# Patient Record
Sex: Male | Born: 1965 | Race: White | Hispanic: No | Smoking: Never smoker
Health system: Southern US, Community
[De-identification: ages and names within clinical notes are randomized; demographics above are authoritative.]

## PROBLEM LIST (undated history)

## (undated) DIAGNOSIS — M7661 Achilles tendinitis, right leg: Secondary | ICD-10-CM

## (undated) DIAGNOSIS — C439 Malignant melanoma of skin, unspecified: Secondary | ICD-10-CM

## (undated) DIAGNOSIS — Z9889 Other specified postprocedural states: Secondary | ICD-10-CM

## (undated) DIAGNOSIS — I8393 Asymptomatic varicose veins of bilateral lower extremities: Secondary | ICD-10-CM

## (undated) DIAGNOSIS — C801 Malignant (primary) neoplasm, unspecified: Secondary | ICD-10-CM

## (undated) DIAGNOSIS — Z87442 Personal history of urinary calculi: Secondary | ICD-10-CM

## (undated) HISTORY — PX: OPEN REPAIR PERIARTICULAR FRACTURE / DISLOCATION ELBOW: SUR901

## (undated) HISTORY — PX: HERNIA REPAIR: SHX51

## (undated) HISTORY — PX: NASAL FRACTURE SURGERY: SHX718

## (undated) HISTORY — PX: MR FOOT LEFT (ARMC HX): HXRAD1768

## (undated) HISTORY — PX: ROTATOR CUFF REPAIR: SHX139

## (undated) HISTORY — PX: OTHER SURGICAL HISTORY: SHX169

## (undated) HISTORY — PX: BASAL CELL CARCINOMA EXCISION: SHX1214

## (undated) HISTORY — PX: KNEE ARTHROSCOPY W/ MENISCAL REPAIR: SHX1877

---

## 1998-06-01 ENCOUNTER — Ambulatory Visit (HOSPITAL_COMMUNITY): Admission: RE | Admit: 1998-06-01 | Discharge: 1998-06-01 | Payer: Self-pay | Admitting: Orthopedic Surgery

## 2000-08-16 ENCOUNTER — Other Ambulatory Visit: Admission: RE | Admit: 2000-08-16 | Discharge: 2000-08-16 | Payer: Self-pay | Admitting: Urology

## 2000-08-16 ENCOUNTER — Encounter (INDEPENDENT_AMBULATORY_CARE_PROVIDER_SITE_OTHER): Payer: Self-pay | Admitting: Specialist

## 2000-12-21 ENCOUNTER — Ambulatory Visit (HOSPITAL_BASED_OUTPATIENT_CLINIC_OR_DEPARTMENT_OTHER): Admission: RE | Admit: 2000-12-21 | Discharge: 2000-12-21 | Payer: Self-pay | Admitting: Family Medicine

## 2001-01-17 ENCOUNTER — Ambulatory Visit (HOSPITAL_BASED_OUTPATIENT_CLINIC_OR_DEPARTMENT_OTHER): Admission: RE | Admit: 2001-01-17 | Discharge: 2001-01-17 | Payer: Self-pay | Admitting: Internal Medicine

## 2003-10-03 ENCOUNTER — Other Ambulatory Visit: Payer: Self-pay

## 2003-10-04 ENCOUNTER — Other Ambulatory Visit: Payer: Self-pay

## 2005-12-08 ENCOUNTER — Other Ambulatory Visit: Payer: Self-pay

## 2005-12-08 ENCOUNTER — Observation Stay: Payer: Self-pay | Admitting: Internal Medicine

## 2006-11-15 ENCOUNTER — Encounter: Admission: RE | Admit: 2006-11-15 | Discharge: 2006-11-15 | Payer: Self-pay | Admitting: Family Medicine

## 2007-03-05 ENCOUNTER — Inpatient Hospital Stay (HOSPITAL_COMMUNITY): Admission: RE | Admit: 2007-03-05 | Discharge: 2007-03-06 | Payer: Self-pay | Admitting: General Surgery

## 2008-06-08 ENCOUNTER — Encounter: Admission: RE | Admit: 2008-06-08 | Discharge: 2008-06-08 | Payer: Self-pay | Admitting: Family Medicine

## 2008-09-01 IMAGING — CT CT ABD-PELV W/O CM
1 of 3 series · 14 of 32 positions shown, 20 images · non-contrast
Comparison: NONE

CLINICAL DATA: Abdominal pain.  Evaluate for hernia. 

CT ABDOMEN AND PELVIS WITHOUT INTRAVENOUS OR ORAL CONTRAST
TECHNIQUE: Multiple axial 3 millimeter thick slices at 3 
millimeter increments were obtained from the lung base through the 
pelvis.

[Series 2: wo · axial · 0.81mm/px · z∈[+463,+868]mm · 14 of 93 slices shown, 20 images]
[im 6/93  soft-tissue]
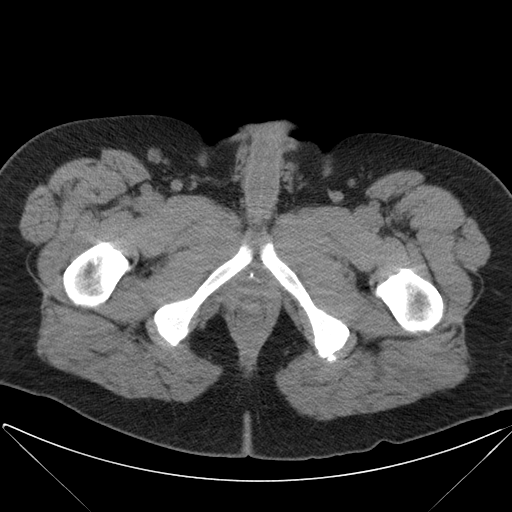
[im 6/93  bone]
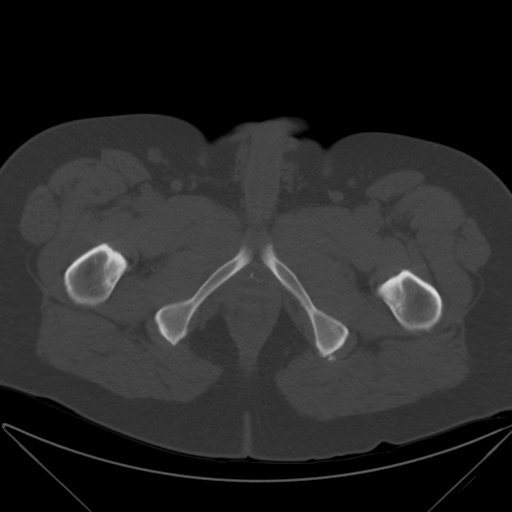
[im 11/93  soft-tissue]
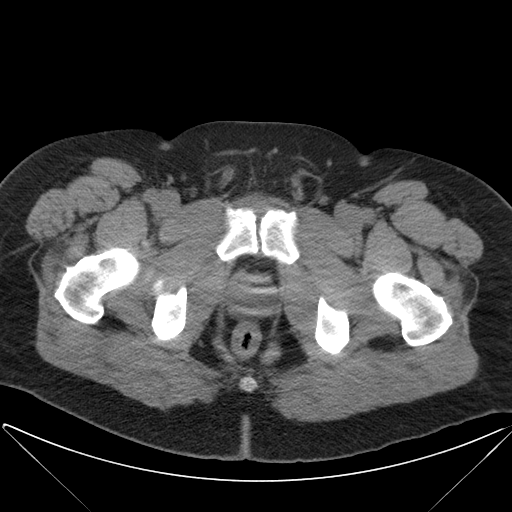
[im 17/93  soft-tissue]
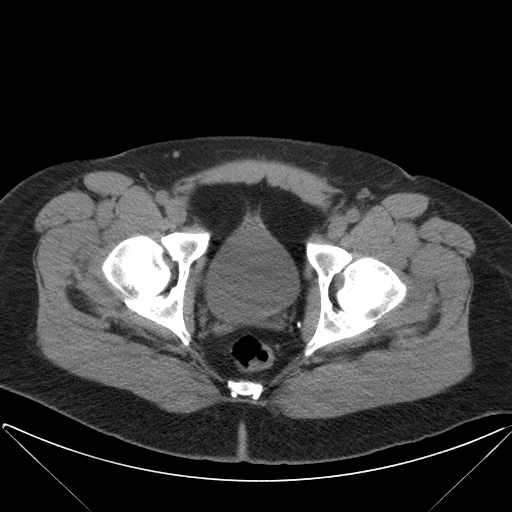
[im 28/93  soft-tissue]
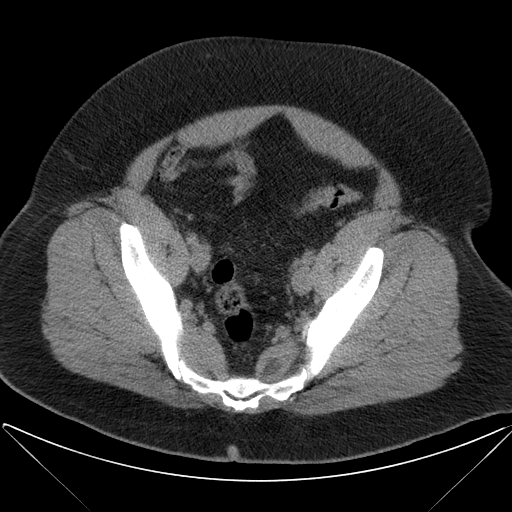
[im 33/93  soft-tissue]
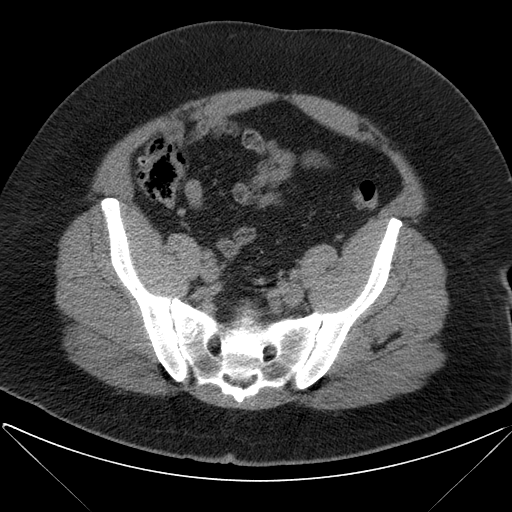
[im 38/93  soft-tissue]
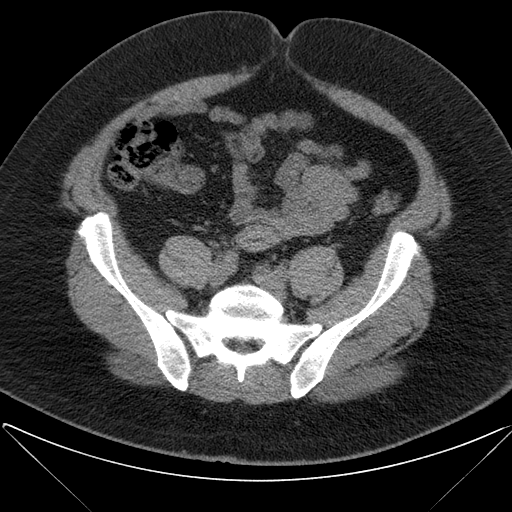
[im 44/93  soft-tissue]
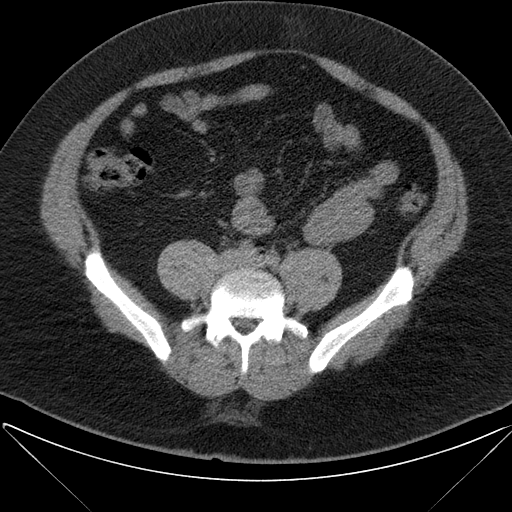
[im 49/93  soft-tissue]
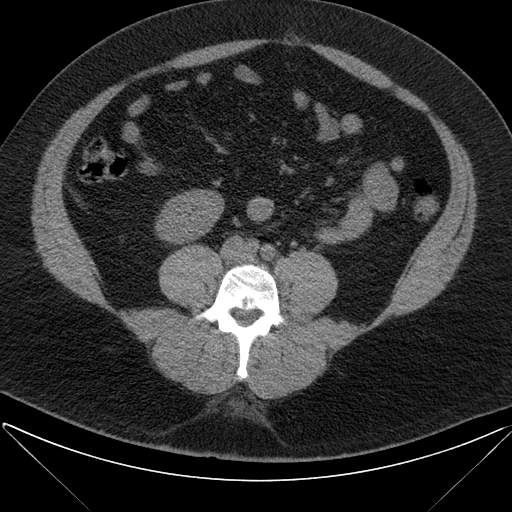
[im 55/93  soft-tissue]
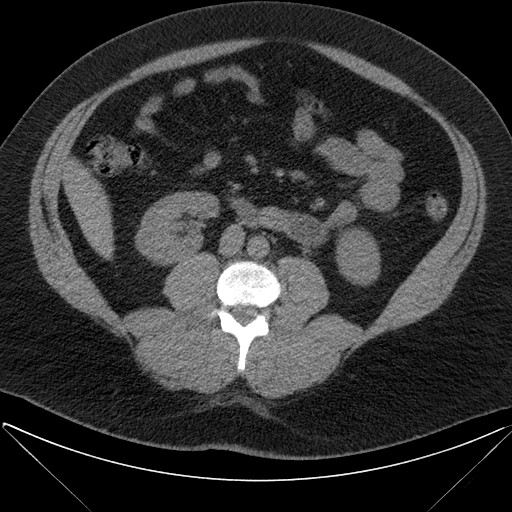
[im 55/93  bone]
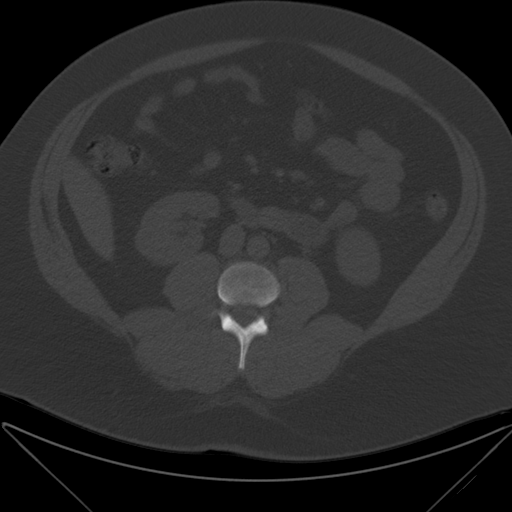
[im 60/93  soft-tissue]
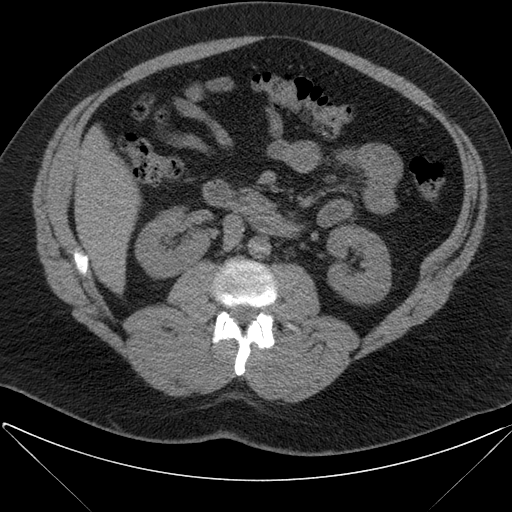
[im 71/93  soft-tissue]
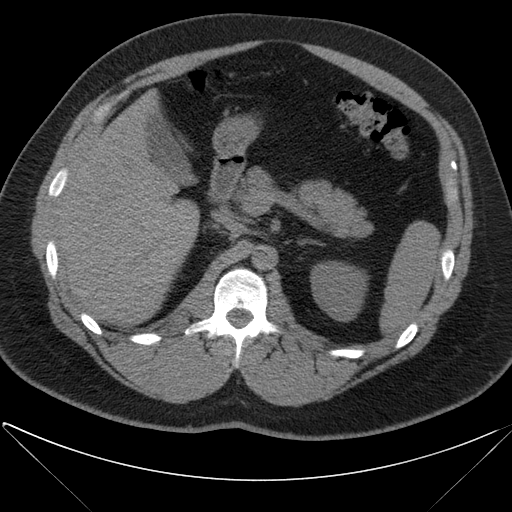
[im 71/93  lung]
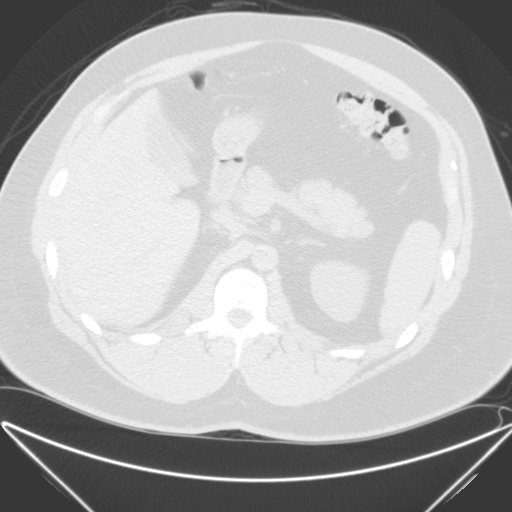
[im 76/93  soft-tissue]
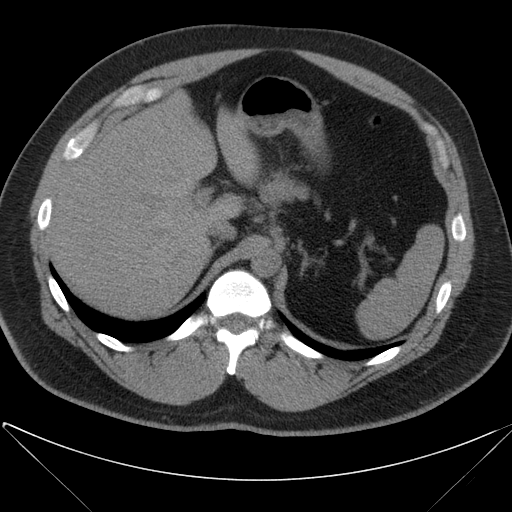
[im 76/93  lung]
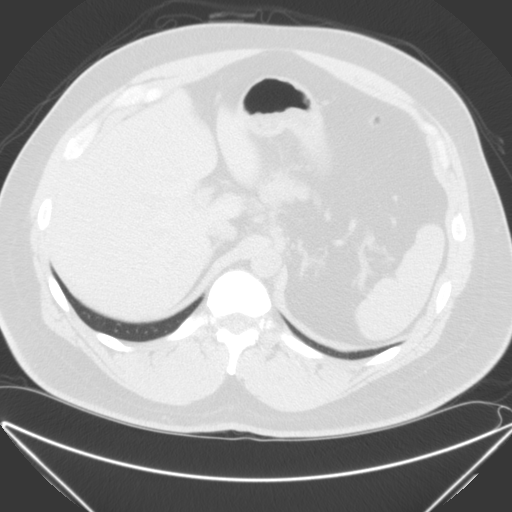
[im 82/93  soft-tissue]
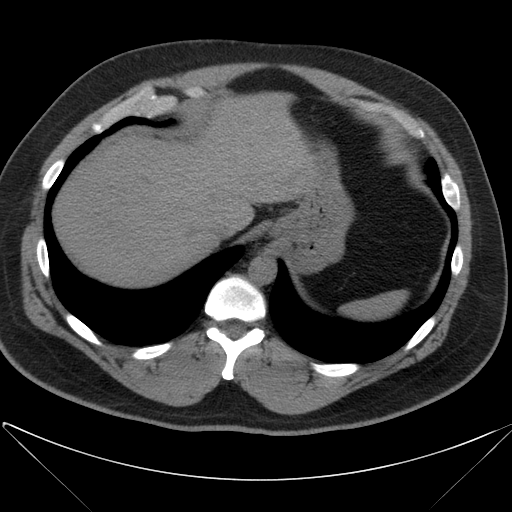
[im 82/93  lung]
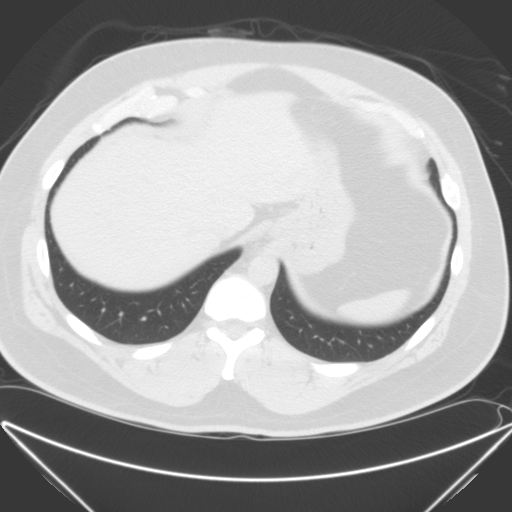
[im 87/93  soft-tissue]
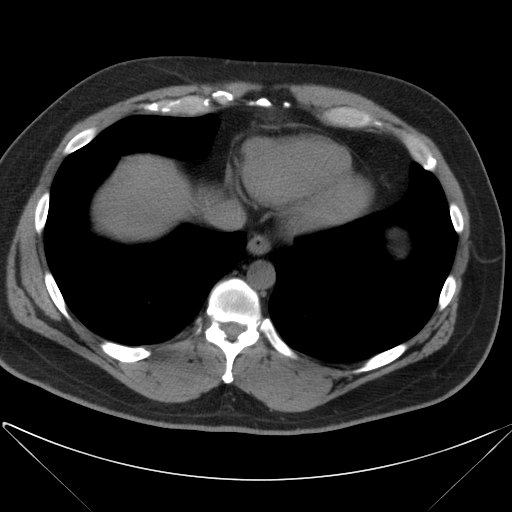
[im 87/93  lung]
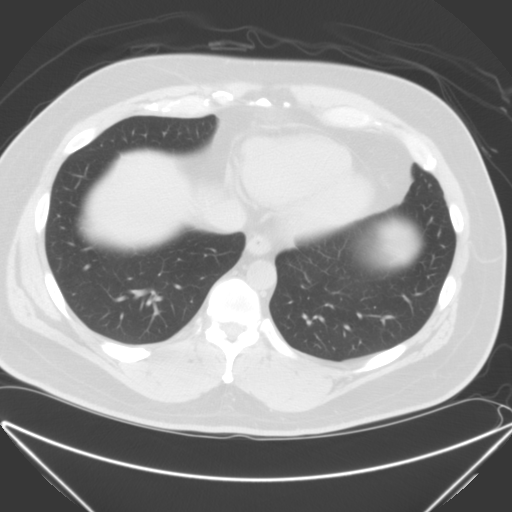

[14 of 32 positions shown; findings below may reference images not displayed]

FINDINGS: No gallstones, renal, or ureteral calculi. No bowel, 
mesenteric, pelvic, or inguinal mass, adenopathy, or inflammatory 
process.  Liver pancreas, spleen, kidneys, and aorta are 
unremarkable.  No evidence of appendicitis, diverticulitis, 
hernia, or bowel obstruction.
IMPRESSION: Negative CT of the abdomen and pelvis without 
intravenous or oral contrast.  No evidence of hernia. Hunberto Geiser 
02/11/2007  Tran Date: 02/11/2007 DAS  JLM

## 2009-05-13 ENCOUNTER — Ambulatory Visit: Payer: Self-pay | Admitting: Otolaryngology

## 2011-03-31 NOTE — Op Note (Signed)
NAME:  Tyler Molina, Tyler Molina                   ACCOUNT NO.:  [id number]   MEDICAL RECORD NO.:  [id number]          PATIENT TYPE:  AMB   LOCATION:  DAY                          FACILITY:  [facility]   PHYSICIAN:  Adolph Pollack, M.D.DATE OF BIRTH:  1966/08/21   DATE OF PROCEDURE:  03/04/2007  DATE OF DISCHARGE:                               OPERATIVE REPORT   PREOPERATIVE DIAGNOSIS:  Ventral hernia.   POSTOPERATIVE DIAGNOSES:  1. Ventral hernia.  2. Small umbilical hernia.   PROCEDURE:  Laparoscopic ventral hernia repair with mesh.   SURGEON:  Adolph Pollack, M.D.   ASSISTANT:  Wilmon Arms. Tsuei, M.D.   ANESTHESIA:  General.   INDICATIONS:  This 45 year old male had been doing some heavy lifting  and noticed a tender bulge.  He underwent a CT scan to evaluate this and  this showed a small ventral hernia in the epigastric region with some  omental fat outside it.  In the office, it was tender I could not  completely reduce it.  He has had no obstructive symptoms.  He now  presents for hernia repair.  He weighs over 300 pounds and thus a  laparoscopic approach has been recommended.   TECHNIQUE:  He was seen in the holding area, then brought to the  operating room, placed supine on the operating table and a general  anesthetic was administered.  The hair on the abdominal wall was clipped  and the area sterilely prepped and draped.  A 5-6 mm incision was made  in the left upper quadrant.  Using the 5 mm laparoscope, and an OptiVu,  access to the peritoneal cavity was gained and a pneumoperitoneum was  created.  I carefully examined the area below the 5 mm trocar and saw no  evidence of solid organ or hollow organ injury.  I was able to visualize  the hernia which was in the epigastric region about 4 cm superior to the  umbilicus.  There was omentum up in it.  A 5 mm trocar was placed in  right mid abdomen, an 11 mm trocar placed in the left mid abdomen.  I  was able to reduce the omentum  from the hernia defect.  I then noticed  he had a small umbilical defect as well.  I used spinal needles to mark  about 4-5 cm away from both defects inferiorly, superiorly and on both  sides.  I then brought the piece of 20 x 15 cm Parietex mesh with a  nonadhesive barrier into the field and placed four anchoring sutures in  the four quadrants.  The mesh was hydrated, rolled up, and then placed  into the abdominal cavity.  I then made four stab incisions in four  areas around the hernia.  The anchoring sutures were then brought up  across the fascial bridge on each of the four areas and then tied down  anchoring the mesh to the anterior abdominal wall with the nonadherent  barrier side pointing toward the viscera.  I further anchored the mesh  to the abdominal wall with a spiral  tacker.  This provided adequate  coverage and adequate overlap of the defects.  Following this, I  inspected the area in all quadrants and noted no bleeding or visceral  injury.  I then removed the trocars and released the CO2 gas.  The skin  incisions were closed with 4-0 Monocryl subcuticular stitches followed  by Steri-Strips and sterile dressings.  He tolerated the procedure  without any apparent complications and was taken to the recovery room in  satisfactory condition.      Adolph Pollack, M.D.  Electronically Signed     TJR/MEDQ  D:  03/04/2007  T:  03/04/2007  Job:  [id number]   cc:   Gretta Arab. Valentina Lucks, M.D.  Fax: [phone]

## 2022-04-13 ENCOUNTER — Other Ambulatory Visit: Payer: Self-pay | Admitting: Podiatry

## 2022-04-19 ENCOUNTER — Other Ambulatory Visit: Payer: Self-pay

## 2022-04-19 ENCOUNTER — Encounter
Admission: RE | Admit: 2022-04-19 | Discharge: 2022-04-19 | Disposition: A | Discharge status: Home / Self Care | Payer: PRIVATE HEALTH INSURANCE | Source: Ambulatory Visit | Attending: Podiatry | Admitting: Podiatry

## 2022-04-19 VITALS — Ht 72.0 in | Wt 319.0 lb

## 2022-04-19 DIAGNOSIS — Z01812 Encounter for preprocedural laboratory examination: Secondary | ICD-10-CM

## 2022-04-19 HISTORY — DX: Malignant (primary) neoplasm, unspecified: C80.1

## 2022-04-19 HISTORY — DX: Personal history of urinary calculi: Z87.442

## 2022-04-19 HISTORY — DX: Achilles tendinitis, left leg: M76.61

## 2022-04-19 HISTORY — DX: Nausea with vomiting, unspecified: Z98.890

## 2022-04-19 HISTORY — DX: Asymptomatic varicose veins of bilateral lower extremities: I83.93

## 2022-04-19 HISTORY — DX: Malignant melanoma of skin, unspecified: C43.9

## 2022-04-19 NOTE — Patient Instructions (Addendum)
Your procedure is scheduled on: 04/21/22 - Friday Report to the Registration Desk on the 1st floor of the [facility]. To find out your arrival time, please call [phone] between 1PM - 3PM on: 04/20/22 - Thursday If your arrival time is 6:00 am, do not arrive prior to that time as the [place] entrance doors do not open until 6:00 am.  REMEMBER: Instructions that are not followed completely may result in serious medical risk, up to and including death; or upon the discretion of your surgeon and anesthesiologist your surgery may need to be rescheduled.  Do not eat food after midnight the night before surgery.  No gum chewing, lozengers or hard candies.  You may however, drink CLEAR liquids up to 2 hours before you are scheduled to arrive for your surgery. Do not drink anything within 2 hours of your scheduled arrival time.  Clear liquids include: - water  - apple juice without pulp - gatorade (not RED colors) - black coffee or tea (Do NOT add milk or creamers to the coffee or tea) Do NOT drink anything that is not on this list.  TAKE THESE MEDICATIONS THE MORNING OF SURGERY WITH A SIP OF WATER: NONE  One week prior to surgery: diclofenac (VOLTAREN) 75 MG EC tablet Stop Anti-inflammatories (NSAIDS) such as Advil, Aleve, Ibuprofen, Motrin, Naproxen, Naprosyn and Aspirin based products such as Excedrin, Goodys Powder, BC Powder.  Stop ANY OVER THE COUNTER supplements until after surgery.  You may however, continue to take Tylenol if needed for pain up until the day of surgery.  No Alcohol for 24 hours before or after surgery.  No Smoking including e-cigarettes for 24 hours prior to surgery.  No chewable tobacco products for at least 6 hours prior to surgery.  No nicotine patches on the day of surgery.  Do not use any "recreational" drugs for at least a week prior to your surgery.  Please be advised that the combination of cocaine and anesthesia may have negative outcomes,  up to and including death. If you test positive for cocaine, your surgery will be cancelled.  On the morning of surgery brush your teeth with toothpaste and water, you may rinse your mouth with mouthwash if you wish. Do not swallow any toothpaste or mouthwash.  Do not wear jewelry, make-up, hairpins, clips or nail polish.  Do not wear lotions, powders, or perfumes.   Do not shave body from the neck down 48 hours prior to surgery just in case you cut yourself which could leave a site for infection.  Also, freshly shaved skin may become irritated if using the CHG soap.  Contact lenses, hearing aids and dentures may not be worn into surgery.  Do not bring valuables to the hospital. [facility] is not responsible for any missing/lost belongings or valuables.    Notify your doctor if there is any change in your medical condition (cold, fever, infection).  Wear comfortable clothing (specific to your surgery type) to the hospital.  After surgery, you can help prevent lung complications by doing breathing exercises.  Take deep breaths and cough every 1-2 hours. Your doctor may order a device called an Incentive Spirometer to help you take deep breaths. When coughing or sneezing, hold a pillow firmly against your incision with both hands. This is called "splinting." Doing this helps protect your incision. It also decreases belly discomfort.  If you are being admitted to the hospital overnight, leave your suitcase in the car. After surgery it may  be brought to your room.  If you are being discharged the day of surgery, you will not be allowed to drive home. You will need a responsible adult (18 years or older) to drive you home and stay with you that night.   If you are taking public transportation, you will need to have a responsible adult (18 years or older) with you. Please confirm with your physician that it is acceptable to use public transportation.   Please call the [facility] Dept. at [phone] if you have any questions about these instructions.  Surgery Visitation Policy:  Patients undergoing a surgery or procedure may have two family members or support persons with them as long as the person is not COVID-19 positive or experiencing its symptoms.   Inpatient Visitation:    Visiting hours are 7 a.m. to 8 p.m. Up to four visitors are allowed at one time in a patient room, including children. The visitors may rotate out with other people during the day. One designated support person (adult) may remain overnight.

## 2022-04-21 ENCOUNTER — Ambulatory Visit
Admission: RE | Admit: 2022-04-21 | Discharge: 2022-04-21 | Disposition: A | Discharge status: Home / Self Care | Payer: PRIVATE HEALTH INSURANCE | Attending: Podiatry | Admitting: Podiatry

## 2022-04-21 ENCOUNTER — Ambulatory Visit: Payer: PRIVATE HEALTH INSURANCE

## 2022-04-21 ENCOUNTER — Encounter: Payer: Self-pay | Admitting: Podiatry

## 2022-04-21 ENCOUNTER — Ambulatory Visit: Payer: PRIVATE HEALTH INSURANCE | Admitting: Anesthesiology

## 2022-04-21 ENCOUNTER — Other Ambulatory Visit: Payer: Self-pay

## 2022-04-21 ENCOUNTER — Encounter
Admission: RE | Disposition: A | Discharge status: Home / Self Care | Payer: Self-pay | Source: Home / Self Care | Attending: Podiatry

## 2022-04-21 DIAGNOSIS — X58XXXA Exposure to other specified factors, initial encounter: Secondary | ICD-10-CM | POA: Insufficient documentation

## 2022-04-21 DIAGNOSIS — S86011A Strain of right Achilles tendon, initial encounter: Secondary | ICD-10-CM | POA: Diagnosis present

## 2022-04-21 DIAGNOSIS — M899 Disorder of bone, unspecified: Secondary | ICD-10-CM | POA: Insufficient documentation

## 2022-04-21 DIAGNOSIS — Z6841 Body Mass Index (BMI) 40.0 and over, adult: Secondary | ICD-10-CM | POA: Diagnosis not present

## 2022-04-21 DIAGNOSIS — Z01812 Encounter for preprocedural laboratory examination: Secondary | ICD-10-CM

## 2022-04-21 HISTORY — PX: BONE EXCISION: SHX6730

## 2022-04-21 HISTORY — PX: ACHILLES TENDON SURGERY: SHX542

## 2022-04-21 LAB — BASIC METABOLIC PANEL
Anion gap: 4 — ABNORMAL LOW (ref 5–15)
BUN: 18 mg/dL (ref 6–20)
CO2: 25 mmol/L (ref 22–32)
Calcium: 8.6 mg/dL — ABNORMAL LOW (ref 8.9–10.3)
Chloride: 109 mmol/L (ref 98–111)
Creatinine, Ser: 1.05 mg/dL (ref 0.61–1.24)
GFR, Estimated: >60 mL/min (ref >60)
Glucose, Bld: 98 mg/dL (ref 70–99)
Potassium: 4 mmol/L (ref 3.5–5.1)
Sodium: 138 mmol/L (ref 135–145)

## 2022-04-21 SURGERY — REPAIR, TENDON, ACHILLES
Anesthesia: General | Site: Foot | Laterality: Right

## 2022-04-21 MED ORDER — SUGAMMADEX SODIUM 200 MG/2ML IV SOLN
INTRAVENOUS | Status: DC | PRN
  Administered 2022-04-21: 200 mg via INTRAVENOUS

## 2022-04-21 MED ORDER — FAMOTIDINE 20 MG PO TABS
ORAL_TABLET | ORAL | Status: AC
  Administered 2022-04-21: 20 mg via ORAL
  Filled 2022-04-21: qty 1

## 2022-04-21 MED ORDER — METOCLOPRAMIDE HCL 10 MG PO TABS: 5.0000 mg | ORAL_TABLET | Freq: Three times a day (TID) | ORAL | Status: DC | PRN

## 2022-04-21 MED ORDER — CEFAZOLIN IN SODIUM CHLORIDE 3-0.9 GM/100ML-% IV SOLN
3.0000 g | INTRAVENOUS | Status: AC
  Administered 2022-04-21: 3 g via INTRAVENOUS
  Filled 2022-04-21: qty 100

## 2022-04-21 MED ORDER — OXYCODONE HCL 5 MG PO TABS
5.0000 mg | ORAL_TABLET | Freq: Once | ORAL | Status: AC | PRN
  Administered 2022-04-21: 5 mg via ORAL

## 2022-04-21 MED ORDER — BUPIVACAINE-EPINEPHRINE (PF) 0.25% -1:200000 IJ SOLN
INTRAMUSCULAR | Status: AC
  Filled 2022-04-21: qty 30

## 2022-04-21 MED ORDER — MIDAZOLAM HCL 2 MG/2ML IJ SOLN
INTRAMUSCULAR | Status: DC | PRN
  Administered 2022-04-21: 2 mg via INTRAVENOUS

## 2022-04-21 MED ORDER — ONDANSETRON HCL 4 MG/2ML IJ SOLN: 4.0000 mg | Freq: Four times a day (QID) | INTRAMUSCULAR | Status: DC | PRN

## 2022-04-21 MED ORDER — ONDANSETRON HCL 4 MG PO TABS: 4.0000 mg | ORAL_TABLET | Freq: Four times a day (QID) | ORAL | Status: DC | PRN

## 2022-04-21 MED ORDER — FENTANYL CITRATE (PF) 100 MCG/2ML IJ SOLN
INTRAMUSCULAR | Status: AC
  Filled 2022-04-21: qty 2

## 2022-04-21 MED ORDER — OXYCODONE HCL 5 MG/5ML PO SOLN: 5.0000 mg | Freq: Once | ORAL | Status: AC | PRN

## 2022-04-21 MED ORDER — FENTANYL CITRATE (PF) 100 MCG/2ML IJ SOLN
INTRAMUSCULAR | Status: AC
  Administered 2022-04-21: 50 ug via INTRAVENOUS
  Filled 2022-04-21: qty 2

## 2022-04-21 MED ORDER — OXYCODONE HCL 5 MG PO TABS
ORAL_TABLET | ORAL | Status: AC
  Filled 2022-04-21: qty 1

## 2022-04-21 MED ORDER — PROPOFOL 10 MG/ML IV BOLUS
INTRAVENOUS | Status: DC | PRN
  Administered 2022-04-21: 200 mg via INTRAVENOUS
  Administered 2022-04-21: 40 mg via INTRAVENOUS

## 2022-04-21 MED ORDER — LIDOCAINE HCL (PF) 1 % IJ SOLN
INTRAMUSCULAR | Status: DC | PRN
  Administered 2022-04-21 (×2): 1 mL via INTRADERMAL

## 2022-04-21 MED ORDER — BUPIVACAINE LIPOSOME 1.3 % IJ SUSP
INTRAMUSCULAR | Status: DC | PRN
  Administered 2022-04-21: 10 mL

## 2022-04-21 MED ORDER — KETAMINE HCL 50 MG/5ML IJ SOSY
PREFILLED_SYRINGE | INTRAMUSCULAR | Status: AC
  Filled 2022-04-21: qty 5

## 2022-04-21 MED ORDER — CEFAZOLIN SODIUM-DEXTROSE 2-4 GM/100ML-% IV SOLN
INTRAVENOUS | Status: AC
  Filled 2022-04-21: qty 100

## 2022-04-21 MED ORDER — BUPIVACAINE LIPOSOME 1.3 % IJ SUSP
INTRAMUSCULAR | Status: AC
  Filled 2022-04-21: qty 10

## 2022-04-21 MED ORDER — FENTANYL CITRATE (PF) 100 MCG/2ML IJ SOLN
25.0000 ug | INTRAMUSCULAR | Status: DC | PRN
  Administered 2022-04-21 (×2): 50 ug via INTRAVENOUS

## 2022-04-21 MED ORDER — ACETAMINOPHEN 10 MG/ML IV SOLN: 1000.0000 mg | Freq: Once | INTRAVENOUS | Status: DC | PRN

## 2022-04-21 MED ORDER — PHENYLEPHRINE HCL (PRESSORS) 10 MG/ML IV SOLN: INTRAVENOUS | Status: DC | PRN

## 2022-04-21 MED ORDER — BUPIVACAINE HCL (PF) 0.5 % IJ SOLN
INTRAMUSCULAR | Status: AC
  Filled 2022-04-21: qty 30

## 2022-04-21 MED ORDER — LIDOCAINE HCL (CARDIAC) PF 100 MG/5ML IV SOSY: PREFILLED_SYRINGE | INTRAVENOUS | Status: DC | PRN

## 2022-04-21 MED ORDER — CHLORHEXIDINE GLUCONATE 0.12 % MT SOLN: 15.0000 mL | Freq: Once | OROMUCOSAL | Status: AC

## 2022-04-21 MED ORDER — DROPERIDOL 2.5 MG/ML IJ SOLN: 0.6250 mg | Freq: Once | INTRAMUSCULAR | Status: DC | PRN

## 2022-04-21 MED ORDER — OXYCODONE-ACETAMINOPHEN 5-325 MG PO TABS: 1.0000 | ORAL_TABLET | Freq: Four times a day (QID) | ORAL | 0 refills | Status: AC | PRN

## 2022-04-21 MED ORDER — ONDANSETRON HCL 4 MG/2ML IJ SOLN
INTRAMUSCULAR | Status: DC | PRN
  Administered 2022-04-21: 4 mg via INTRAVENOUS

## 2022-04-21 MED ORDER — MIDAZOLAM HCL 2 MG/2ML IJ SOLN
INTRAMUSCULAR | Status: AC
  Filled 2022-04-21: qty 2

## 2022-04-21 MED ORDER — 0.9 % SODIUM CHLORIDE (POUR BTL) OPTIME
TOPICAL | Status: DC | PRN
  Administered 2022-04-21 (×2): 500 mL

## 2022-04-21 MED ORDER — LIDOCAINE HCL (PF) 1 % IJ SOLN
INTRAMUSCULAR | Status: AC
  Filled 2022-04-21: qty 2

## 2022-04-21 MED ORDER — EPHEDRINE SULFATE (PRESSORS) 50 MG/ML IJ SOLN
INTRAMUSCULAR | Status: DC | PRN
  Administered 2022-04-21: 10 mg via INTRAVENOUS
  Administered 2022-04-21: 15 mg via INTRAVENOUS

## 2022-04-21 MED ORDER — FAMOTIDINE 20 MG PO TABS: 20.0000 mg | ORAL_TABLET | Freq: Once | ORAL | Status: AC

## 2022-04-21 MED ORDER — ACETAMINOPHEN 10 MG/ML IV SOLN
INTRAVENOUS | Status: DC | PRN
  Administered 2022-04-21: 1000 mg via INTRAVENOUS

## 2022-04-21 MED ORDER — ROCURONIUM BROMIDE 100 MG/10ML IV SOLN
INTRAVENOUS | Status: DC | PRN
  Administered 2022-04-21: 40 mg via INTRAVENOUS
  Administered 2022-04-21: 60 mg via INTRAVENOUS

## 2022-04-21 MED ORDER — CHLORHEXIDINE GLUCONATE 0.12 % MT SOLN
OROMUCOSAL | Status: AC
  Administered 2022-04-21: 15 mL via OROMUCOSAL
  Filled 2022-04-21: qty 15

## 2022-04-21 MED ORDER — PROMETHAZINE HCL 25 MG/ML IJ SOLN: 6.2500 mg | INTRAMUSCULAR | Status: DC | PRN

## 2022-04-21 MED ORDER — DEXMEDETOMIDINE (PRECEDEX) IN NS 20 MCG/5ML (4 MCG/ML) IV SYRINGE
PREFILLED_SYRINGE | INTRAVENOUS | Status: DC | PRN
  Administered 2022-04-21: 12 ug via INTRAVENOUS
  Administered 2022-04-21: 8 ug via INTRAVENOUS

## 2022-04-21 MED ORDER — BUPIVACAINE HCL (PF) 0.5 % IJ SOLN
INTRAMUSCULAR | Status: AC
  Filled 2022-04-21: qty 20

## 2022-04-21 MED ORDER — LIDOCAINE HCL (PF) 1 % IJ SOLN
INTRAMUSCULAR | Status: AC
  Filled 2022-04-21: qty 30

## 2022-04-21 MED ORDER — BUPIVACAINE HCL (PF) 0.5 % IJ SOLN
INTRAMUSCULAR | Status: DC | PRN
  Administered 2022-04-21 (×2): 50 mg via PERINEURAL

## 2022-04-21 MED ORDER — LIDOCAINE HCL (PF) 1 % IJ SOLN
INTRAMUSCULAR | Status: AC
  Administered 2022-04-21: 5 mL
  Filled 2022-04-21: qty 5

## 2022-04-21 MED ORDER — BUPIVACAINE-EPINEPHRINE (PF) 0.25% -1:200000 IJ SOLN
INTRAMUSCULAR | Status: DC | PRN
  Administered 2022-04-21: 10 mL

## 2022-04-21 MED ORDER — FENTANYL CITRATE (PF) 100 MCG/2ML IJ SOLN
INTRAMUSCULAR | Status: DC | PRN
  Administered 2022-04-21 (×4): 50 ug via INTRAVENOUS

## 2022-04-21 MED ORDER — DEXAMETHASONE SODIUM PHOSPHATE 10 MG/ML IJ SOLN
INTRAMUSCULAR | Status: DC | PRN
  Administered 2022-04-21: 10 mg via INTRAVENOUS

## 2022-04-21 MED ORDER — PROPOFOL 10 MG/ML IV BOLUS
INTRAVENOUS | Status: AC
  Filled 2022-04-21: qty 20

## 2022-04-21 MED ORDER — PHENYLEPHRINE 80 MCG/ML (10ML) SYRINGE FOR IV PUSH (FOR BLOOD PRESSURE SUPPORT)
PREFILLED_SYRINGE | INTRAVENOUS | Status: DC | PRN
  Administered 2022-04-21 (×2): 160 ug via INTRAVENOUS
  Administered 2022-04-21 (×2): 80 ug via INTRAVENOUS
  Administered 2022-04-21 (×2): 160 ug via INTRAVENOUS
  Administered 2022-04-21 (×3): 80 ug via INTRAVENOUS

## 2022-04-21 MED ORDER — ORAL CARE MOUTH RINSE: 15.0000 mL | Freq: Once | OROMUCOSAL | Status: AC

## 2022-04-21 MED ORDER — METOCLOPRAMIDE HCL 5 MG/ML IJ SOLN: 5.0000 mg | Freq: Three times a day (TID) | INTRAMUSCULAR | Status: DC | PRN

## 2022-04-21 MED ORDER — KETAMINE HCL 10 MG/ML IJ SOLN
INTRAMUSCULAR | Status: DC | PRN
  Administered 2022-04-21: 30 mg via INTRAVENOUS

## 2022-04-21 MED ORDER — LACTATED RINGERS IV SOLN: INTRAVENOUS | Status: DC

## 2022-04-21 SURGICAL SUPPLY — 61 items
ANCH SUT 4.5 FTPRNT PEEK-OPTM (Anchor) ×2 IMPLANT
ANCHOR 4.5 FOOTPRINT ULTRA (Anchor) ×1 IMPLANT
BIT DRILL 4X4.5 FOOTPRINT STR (BIT) IMPLANT
BLADE SURG 15 STRL LF DISP TIS (BLADE) ×2 IMPLANT
BLADE SURG 15 STRL SS (BLADE) ×12
BLADE SURG MINI STRL (BLADE) ×3 IMPLANT
BNDG CMPR STD VLCR NS LF 5.8X4 (GAUZE/BANDAGES/DRESSINGS) ×4
BNDG CONFORM 2 STRL LF (GAUZE/BANDAGES/DRESSINGS) ×3 IMPLANT
BNDG ELASTIC 4X5.8 VLCR NS LF (GAUZE/BANDAGES/DRESSINGS) ×6 IMPLANT
BNDG ESMARK 4X12 TAN STRL LF (GAUZE/BANDAGES/DRESSINGS) ×3 IMPLANT
BNDG ESMARK 6X12 TAN STRL LF (GAUZE/BANDAGES/DRESSINGS) ×3 IMPLANT
BNDG GAUZE ELAST 4 BULKY (GAUZE/BANDAGES/DRESSINGS) ×3 IMPLANT
BOOT STEPPER DURA XLG (SOFTGOODS) ×1 IMPLANT
DRAPE FLUOR MINI C-ARM 54X84 (DRAPES) ×3 IMPLANT
DRILL 4X4.5 FOOTPRINT STR (BIT) ×3
DURAPREP 26ML APPLICATOR (WOUND CARE) ×3 IMPLANT
ELECT REM PT RETURN 9FT ADLT (ELECTROSURGICAL) ×3
ELECTRODE REM PT RTRN 9FT ADLT (ELECTROSURGICAL) ×2 IMPLANT
GAUZE 4X4 16PLY ~~LOC~~+RFID DBL (SPONGE) ×1 IMPLANT
GAUZE SPONGE 4X4 12PLY STRL (GAUZE/BANDAGES/DRESSINGS) ×3 IMPLANT
GAUZE XEROFORM 1X8 LF (GAUZE/BANDAGES/DRESSINGS) ×3 IMPLANT
GLOVE BIO SURGEON STRL SZ7.5 (GLOVE) ×3 IMPLANT
GLOVE SURG UNDER LTX SZ8 (GLOVE) ×3 IMPLANT
GOWN STRL REUS W/ TWL XL LVL3 (GOWN DISPOSABLE) ×4 IMPLANT
GOWN STRL REUS W/TWL XL LVL3 (GOWN DISPOSABLE) ×6
HANDLE YANKAUER SUCT BULB TIP (MISCELLANEOUS) ×3 IMPLANT
KIT TURNOVER KIT A (KITS) ×3 IMPLANT
LABEL OR SOLS (LABEL) IMPLANT
MANIFOLD NEPTUNE II (INSTRUMENTS) ×3 IMPLANT
NDL FILTER BLUNT 18X1 1/2 (NEEDLE) ×2 IMPLANT
NDL HYPO 25X1 1.5 SAFETY (NEEDLE) ×6 IMPLANT
NDL MAYO CATGUT SZ5 (NEEDLE) ×3
NDL SUT 5 .5 CRC TPR PNT MAYO (NEEDLE) ×2 IMPLANT
NEEDLE FILTER BLUNT 18X 1/2SAF (NEEDLE) ×1
NEEDLE FILTER BLUNT 18X1 1/2 (NEEDLE) ×2 IMPLANT
NEEDLE HYPO 25X1 1.5 SAFETY (NEEDLE) ×9 IMPLANT
NS IRRIG 500ML POUR BTL (IV SOLUTION) ×4 IMPLANT
PACK EXTREMITY ARMC (MISCELLANEOUS) ×3 IMPLANT
RASP SM TEAR CROSS CUT (RASP) ×1 IMPLANT
SPLINT CAST 1 STEP 5X30 WHT (MISCELLANEOUS) ×3 IMPLANT
SPLINT FAST PLASTER 5X30 (CAST SUPPLIES) ×1
SPLINT PLASTER CAST FAST 5X30 (CAST SUPPLIES) ×2 IMPLANT
STOCKINETTE IMPERV 14X48 (MISCELLANEOUS) ×1 IMPLANT
STOCKINETTE M/LG 89821 (MISCELLANEOUS) ×3 IMPLANT
STRIP CLOSURE SKIN 1/2X4 (GAUZE/BANDAGES/DRESSINGS) ×3 IMPLANT
SUT MNCRL 4-0 (SUTURE) ×3
SUT MNCRL 4-0 27XMFL (SUTURE) ×2
SUT PDS PLUS 0 (SUTURE) ×3
SUT PDS PLUS AB 0 CT-2 (SUTURE) IMPLANT
SUT ULTRABRAID #2 38 (SUTURE) ×1 IMPLANT
SUT VIC AB 0 SH 27 (SUTURE) ×3 IMPLANT
SUT VIC AB 2-0 SH 27 (SUTURE) ×3
SUT VIC AB 2-0 SH 27XBRD (SUTURE) ×4 IMPLANT
SUT VIC AB 3-0 SH 27 (SUTURE) ×6
SUT VIC AB 3-0 SH 27X BRD (SUTURE) IMPLANT
SUT VIC AB 4-0 FS2 27 (SUTURE) ×3 IMPLANT
SUTURE MNCRL 4-0 27XMF (SUTURE) ×2 IMPLANT
SWABSTK COMLB BENZOIN TINCTURE (MISCELLANEOUS) ×3 IMPLANT
SYR 10ML LL (SYRINGE) ×6 IMPLANT
SYR 3ML LL SCALE MARK (SYRINGE) ×3 IMPLANT
WAND TOPAZ MICRO DEBRIDER (MISCELLANEOUS) ×1 IMPLANT

## 2022-04-21 NOTE — Discharge Instructions (Addendum)
[place]  POST OPERATIVE INSTRUCTIONS FOR DR. Vickki Muff AND DR. [place]   Take your medication as prescribed.  Pain medication should be taken only as needed.  Keep the dressing clean, dry and intact.  Keep your foot elevated above the heart level for the first 48 hours.  Walking to the bathroom and brief periods of walking are acceptable, unless we have instructed you to be non-weight bearing.  Always wear your post-op shoe when walking.  Always use your crutches if you are to be non-weight bearing.  Do not take a shower. Baths are permissible as long as the foot is kept out of the water.   Every hour you are awake:  Bend your knee 15 times.   Call [facility] [phone]) if any of the following problems occur: You develop a temperature or fever. The bandage becomes saturated with blood. Medication does not stop your pain. Injury of the foot occurs. Any symptoms of infection including redness, odor, or red streaks running from wound.    AMBULATORY SURGERY  DISCHARGE INSTRUCTIONS   The drugs that you were given will stay in your system until tomorrow so for the next 24 hours you should not:  Drive an automobile Make any legal decisions Drink any alcoholic beverage   You may resume regular meals tomorrow.  Today it is better to start with liquids and gradually work up to solid foods.  You may eat anything you prefer, but it is better to start with liquids, then soup and crackers, and gradually work up to solid foods.   Please notify your doctor immediately if you have any unusual bleeding, trouble breathing, redness and pain at the surgery site, drainage, fever, or pain not relieved by medication.    Your post-operative visit with Dr.                                       is: Date:                        Time:    Please call to schedule your post-operative visit.  Additional  Instructions:

## 2022-04-21 NOTE — Transfer of Care (Signed)
Immediate Anesthesia Transfer of Care Note  Patient: Tyler Molina  Procedure(s) Performed: 80221 - ACHILLES TENDON REPAIR - SECONDARY (Right: Foot) 28120 - PARTIAL CALCANECTOMY (Right)  Patient Location: PACU  Anesthesia Type:General  Level of Consciousness: awake  Airway & Oxygen Therapy: Patient Spontanous Breathing  Post-op Assessment: Report given to RN and Post -op Vital signs reviewed and stable  Post vital signs: Reviewed and stable  Last Vitals:  Vitals Value Taken Time  BP 136/91 04/21/22 1411  Temp    Pulse 75 04/21/22 1414  Resp 10 04/21/22 1414  SpO2 92 % 04/21/22 1414  Vitals shown include unvalidated device data.  Last Pain:  Vitals:   04/21/22 0834  TempSrc: Temporal  PainSc: 8       Patients Stated Pain Goal: 3 (79/81/02 5486)  Complications: No notable events documented.

## 2022-04-21 NOTE — Anesthesia Preprocedure Evaluation (Addendum)
Anesthesia Evaluation  Patient identified by MRN, date of birth, ID band Patient awake    Reviewed: Allergy & Precautions, NPO status , Patient's Chart, lab work & pertinent test results  Airway Mallampati: III  TM Distance: >3 FB Neck ROM: full    Dental  (+) Chipped   Pulmonary neg pulmonary ROS,    Pulmonary exam normal        Cardiovascular Exercise Tolerance: Good negative cardio ROS Normal cardiovascular exam     Neuro/Psych negative neurological ROS  negative psych ROS   GI/Hepatic negative GI ROS, Neg liver ROS,   Endo/Other  Morbid obesity  Renal/GU      Musculoskeletal Achilles tendonitis of right lower extremity  Posterior calcanneal exostosis, right   Abdominal   Peds  Hematology negative hematology ROS (+)   Anesthesia Other Findings Past Medical History: No date: Achilles tendinitis of both lower extremities No date: Cancer ([place])     Comment:  basal cell - bilateral shoulders, No date: History of kidney stones No date: Melanoma ([place]) No date: PONV (postoperative nausea and vomiting) No date: Varicose veins of both lower extremities  Past Surgical History: No date: BASAL CELL CARCINOMA EXCISION     Comment:  top of scalp No date: carpal tunnal ; Right No date: dislocated elbow; Right No date: hand laceration ; Right     Comment:  thumb No date: HERNIA REPAIR     Comment:  x 2 No date: KNEE ARTHROSCOPY W/ MENISCAL REPAIR No date: MR FOOT LEFT ([facility] HX)     Comment:  fracture No date: NASAL FRACTURE SURGERY No date: OPEN REPAIR PERIARTICULAR FRACTURE / DISLOCATION ELBOW; Right No date: ROTATOR CUFF REPAIR     Comment:  shoulders No date: skin cancer biopsy; Bilateral     Comment:  shoulders  BMI    Body Mass Index: 43.26 kg/m      Reproductive/Obstetrics negative OB ROS                            Anesthesia Physical Anesthesia Plan  ASA: 3  Anesthesia  Plan: General ETT   Post-op Pain Management: Regional block*   Induction: Intravenous  PONV Risk Score and Plan: Ondansetron, Dexamethasone, Midazolam and Treatment may vary due to age or medical condition  Airway Management Planned: Oral ETT  Additional Equipment:   Intra-op Plan:   Post-operative Plan: Extubation in OR  Informed Consent: I have reviewed the patients History and Physical, chart, labs and discussed the procedure including the risks, benefits and alternatives for the proposed anesthesia with the patient or authorized representative who has indicated his/her understanding and acceptance.     Dental Advisory Given  Plan Discussed with: Anesthesiologist, CRNA and Surgeon  Anesthesia Plan Comments:        Anesthesia Quick Evaluation

## 2022-04-21 NOTE — Anesthesia Procedure Notes (Signed)
Procedure Name: Intubation Date/Time: 04/21/2022 12:14 PM  Performed by: Biagio Borg, CRNAPre-anesthesia Checklist: Patient identified, Emergency Drugs available, Suction available and Patient being monitored Patient Re-evaluated:Patient Re-evaluated prior to induction Oxygen Delivery Method: Circle system utilized Preoxygenation: Pre-oxygenation with 100% oxygen Induction Type: IV induction Ventilation: Mask ventilation without difficulty Laryngoscope Size: McGraph and 4 Grade View: Grade I Tube type: Oral Number of attempts: 1 Airway Equipment and Method: Stylet and Oral airway Placement Confirmation: ETT inserted through vocal cords under direct vision, positive ETCO2 and breath sounds checked- equal and bilateral Secured at: 22 cm Tube secured with: Tape Dental Injury: Teeth and Oropharynx as per pre-operative assessment

## 2022-04-21 NOTE — Anesthesia Procedure Notes (Addendum)
Anesthesia Regional Block: Adductor canal block   Pre-Anesthetic Checklist: , timeout performed,  Correct Patient, Correct Site, Correct Laterality,  Correct Procedure, Correct Position, site marked,  Risks and benefits discussed,  Surgical consent,  Pre-op evaluation,  At surgeon's request and post-op pain management  Laterality: Right  Prep: chloraprep       Needles:  Injection technique: Single-shot  Needle Type: Stimiplex     Needle Length: 9cm  Needle Gauge: 22     Additional Needles:   Procedures:,,,, ultrasound used (permanent image in chart),,    Narrative:  Start time: 04/21/2022 3:00 PM End time: 04/21/2022 3:04 PM Injection made incrementally with aspirations every 20 mL.  Performed by: Personally  Anesthesiologist: Iran Ouch, MD  Additional Notes: Patient consented for risk and benefits of nerve block including but not limited to nerve damage, failed block, bleeding and infection.  Patient voiced understanding.  Functioning IV was confirmed and monitors were applied.  Timeout done prior to procedure and prior to any sedation being given to the patient.  Patient confirmed procedure site prior to any sedation given to the patient.  A 21m 22ga Stimuplex needle was used. Sterile prep,hand hygiene and sterile gloves were used.  Minimal sedation used for procedure.  No paresthesia endorsed by patient during the procedure.  Negative aspiration and negative test dose prior to incremental administration of local anesthetic. The patient tolerated the procedure well with no immediate complications.

## 2022-04-21 NOTE — Anesthesia Postprocedure Evaluation (Signed)
Anesthesia Post Note  Patient: Tyler Molina  Procedure(s) Performed: 24097 - ACHILLES TENDON REPAIR - SECONDARY (Right: Foot) 28120 - PARTIAL CALCANECTOMY (Right)  Patient location during evaluation: Endoscopy Anesthesia Type: General Level of consciousness: awake and alert Pain management: pain level controlled Vital Signs Assessment: post-procedure vital signs reviewed and stable Respiratory status: spontaneous breathing, nonlabored ventilation and respiratory function stable Cardiovascular status: blood pressure returned to baseline and stable Postop Assessment: no apparent nausea or vomiting Anesthetic complications: no   No notable events documented.   Last Vitals:  Vitals:   04/21/22 1538 04/21/22 1552  BP: (!) 161/83 (!) 143/82  Pulse: 62 63  Resp: 14 14  Temp: (!) 36.1 C   SpO2: 99% 96%    Last Pain:  Vitals:   04/21/22 1552  TempSrc:   PainSc: 0-No pain                 Iran Ouch

## 2022-04-21 NOTE — Anesthesia Procedure Notes (Addendum)
Anesthesia Regional Block: Popliteal block   Pre-Anesthetic Checklist: , timeout performed,  Correct Patient, Correct Site, Correct Laterality,  Correct Procedure, Correct Position, site marked,  Risks and benefits discussed,  Surgical consent,  Pre-op evaluation,  At surgeon's request and post-op pain management  Laterality: Right  Prep: chloraprep       Needles:  Injection technique: Single-shot  Needle Type: Stimiplex     Needle Length: 9cm  Needle Gauge: 22     Additional Needles:   Procedures:,,,, ultrasound used (permanent image in chart),,    Narrative:  Start time: 04/21/2022 3:05 PM End time: 04/21/2022 3:08 PM Injection made incrementally with aspirations every 20 mL.  Performed by: Personally  Anesthesiologist: Iran Ouch, MD  Additional Notes: Patient consented for risk and benefits of nerve block including but not limited to nerve damage, failed block, bleeding and infection.  Patient voiced understanding.  Functioning IV was confirmed and monitors were applied.  Timeout done prior to procedure and prior to any sedation being given to the patient.  Patient confirmed procedure site prior to any sedation given to the patient.  A 78m 22ga Stimuplex needle was used. Sterile prep,hand hygiene and sterile gloves were used.  Minimal sedation used for procedure.  No paresthesia endorsed by patient during the procedure.  Negative aspiration and negative test dose prior to incremental administration of local anesthetic. The patient tolerated the procedure well with no immediate complications.

## 2022-04-21 NOTE — Op Note (Signed)
Operative note   Surgeon:Briton Sellman Lawyer: None    Preop diagnosis: 1.  Partial Achilles tendon tear posterior right heel 2.  Calcaneal exostosis posterior right calcaneus     Postop diagnosis: Same    Procedure:1.  Achilles tendon repair secondary posterior right Achilles tendon 2.  Posterior calcaneal exostectomy right heel 3.  Intraoperative fluoroscopy use    EBL: Minimal    Anesthesia:local and general.  Local consisted of a total of 10 cc of 0.25% bupivacaine and 10 cc of Exparel long-acting anesthetic    Hemostasis: Bupivacaine with epinephrine    Specimen: Achilles tendon tear and tendinitis    Complications: None    Operative indications:Tyler Molina is an 56 y.o. that presents today for surgical intervention.  The risks/benefits/alternatives/complications have been discussed and consent has been given.    Procedure:  Patient was brought into the OR and placed on the operating table in theprone position. After anesthesia was obtained theright lower extremity was prepped and draped in usual sterile fashion.  Attention was directed to the posterior aspect of the right heel where longitudinal incision was made along the distal Achilles to the level of the posterior inferior calcaneus.  Sharp and blunt dissection carried down to the peritenon.  The peritenon was then incised.  This exposed the Achilles tendon itself.  Noted fusiform thickening of the Achilles just superior to its insertion on the calcaneus was noted.  Thickening and density of the entire Achilles at its insertional site was noted.  A tendon splitting incision was made from the posterior superior aspect of the calcaneus to the infra aspect of the calcaneus.  The tendon was dissected medial and lateral from the calcaneus.  A large posterior calcaneal spur was noted and this was excised with a combination of osteotomes and a power rasp.  The posterior superior aspect of the calcaneus also had a prominence and  this was excised.  All areas were smoothed appropriately.  The wound was flushed with copious amounts of irrigation.  Attention was directed to the Achilles tendon itself for the large bulk of the fibrotic Achilles was noted at the distal watershed band and along the insertional site.  This was excised down to palpable normal Achilles tendon itself.  Repair of the Achilles tendon was then performed with a 3-0 Vicryl for the deeper and superficial layers.  At this time a Magnum wire was placed along the distal Achilles with a Krakw type of suture.  This was placed into a 5.0 mm footprint bone anchor under mild tension.  The foot was held in a neutral position.  This was noted be quite stable.  Good excursion with plantarflexion of the foot was noted with compression of the calf.  The wound was flushed once again with copious amounts of irrigation.  Intraoperative fluoroscopy was used throughout the case to note the excision of the posterior calcaneus down to healthy bone and it was used to note the preoperative exostosis.  Wounds were then closed with a combination of 3-0 Vicryl for the deeper and subcutaneous tissue and a 4-0 Monocryl for the skin.  A bulky sterile dressing was applied.  Patient was then placed in an equalizer walker boot with the foot in a 90 degree neutral position.    Patient tolerated the procedure and anesthesia well.  Was transported from the OR to the PACU with all vital signs stable and vascular status intact. To be discharged per routine protocol.  Will follow up  in approximately 1 week in the outpatient clinic.

## 2022-04-24 ENCOUNTER — Encounter: Payer: Self-pay | Admitting: Podiatry

## 2022-04-25 LAB — SURGICAL PATHOLOGY

## 2022-04-27 ENCOUNTER — Ambulatory Visit
Admission: RE | Admit: 2022-04-27 | Discharge: 2022-04-27 | Disposition: A | Discharge status: Home / Self Care | Payer: PRIVATE HEALTH INSURANCE | Source: Ambulatory Visit | Attending: Podiatry | Admitting: Podiatry

## 2022-04-27 ENCOUNTER — Emergency Department
Admission: EM | Admit: 2022-04-27 | Discharge: 2022-04-27 | Disposition: A | Discharge status: Home / Self Care | Payer: PRIVATE HEALTH INSURANCE | Attending: Emergency Medicine | Admitting: Emergency Medicine

## 2022-04-27 ENCOUNTER — Other Ambulatory Visit: Payer: Self-pay | Admitting: Podiatry

## 2022-04-27 DIAGNOSIS — Z7901 Long term (current) use of anticoagulants: Secondary | ICD-10-CM | POA: Diagnosis not present

## 2022-04-27 DIAGNOSIS — I82411 Acute embolism and thrombosis of right femoral vein: Secondary | ICD-10-CM | POA: Diagnosis present

## 2022-04-27 DIAGNOSIS — M79661 Pain in right lower leg: Secondary | ICD-10-CM

## 2022-04-27 DIAGNOSIS — M7989 Other specified soft tissue disorders: Secondary | ICD-10-CM | POA: Insufficient documentation

## 2022-04-27 DIAGNOSIS — I82451 Acute embolism and thrombosis of right peroneal vein: Secondary | ICD-10-CM

## 2022-04-27 MED ORDER — APIXABAN 5 MG PO TABS: ORAL_TABLET | ORAL | 1 refills | Status: AC

## 2022-04-27 NOTE — ED Provider Notes (Signed)
   [facility] Provider Note    Event Date/Time   First MD Initiated Contact with Patient 04/27/22 1714     (approximate)   History  Blood clot  HPI  Tyler Molina is a 56 y.o. male with a history of kidney stones, varicose veins who had Achilles tendon surgery on his right foot 1 week ago.  Had ultrasound performed today as an outpatient, was referred to the emergency department for peroneal vein DVT.  No chest pain, no pleurisy, no shortness of     Physical Exam   Triage Vital Signs: ED Triage Vitals  Enc Vitals Group     BP 04/27/22 1721 (!) 149/89     Pulse Rate 04/27/22 1721 77     Resp 04/27/22 1721 18     Temp 04/27/22 1721 98.5 F (36.9 C)     Temp Source 04/27/22 1721 Oral     SpO2 04/27/22 1721 97 %     Weight 04/27/22 1715 (!) 144.7 kg (319 lb 0.1 oz)     Height 04/27/22 1715 1.829 m (6')     Head Circumference --      Peak Flow --      Pain Score 04/27/22 1729 0     Pain Loc --      Pain Edu? --      Excl. in [place]? --     Most recent vital signs: Vitals:   04/27/22 1721  BP: (!) 149/89  Pulse: 77  Resp: 18  Temp: 98.5 F (36.9 C)  SpO2: 97%     General: Awake, no distress.  CV:  Good peripheral perfusion.  Resp:  Normal effort.  Abd:  No distention.  Other:     ED Results / Procedures / Treatments   Labs (all labs ordered are listed, but only abnormal results are displayed) Labs Reviewed - No data to display   EKG     RADIOLOGY Reviewed ultrasound images performed today as an outpatient, consistent with DVT    PROCEDURES:  Critical Care performed:   Procedures   MEDICATIONS ORDERED IN ED: Medications - No data to display   IMPRESSION / MDM / [place] / ED COURSE  I reviewed the triage vital signs and the nursing notes. Patient's presentation is most consistent with acute illness / injury with system symptoms.   Patient presents with peroneal DVT, occlusive found on outpatient  ultrasound  He has no symptoms of PE.  This is a distal DVT, I discussed risks and benefits of treatment with him and he and his wife have opted to start Eliquis.  He had normal lab work performed several days ago.  I have started the patient on Eliquis at his request.  No further work-up needed here in the emergency department, outpatient follow-up with vascular as needed       FINAL CLINICAL IMPRESSION(S) / ED DIAGNOSES   Final diagnoses:  Acute deep vein thrombosis (DVT) of right peroneal vein ([name])     Rx / DC Orders   ED Discharge Orders          Ordered    apixaban (ELIQUIS) 5 MG TABS tablet        04/27/22 1725             Note:  This document was prepared using Dragon voice recognition software and may include unintentional dictation errors.   Tyler Drafts, MD 04/27/22 [id number]

## 2022-04-27 NOTE — ED Triage Notes (Signed)
Patient to ER via POV. Reports having an [place] of his right leg today and being advised to come to the ER for further treatment due to a DVT.
